# Patient Record
Sex: Male | Born: 1995 | Race: White | Hispanic: No | Marital: Single | Smoking: Never smoker
Health system: Southern US, Community
[De-identification: ages and names within clinical notes are randomized; demographics above are authoritative.]

---
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
## 2016-11-07 ENCOUNTER — Ambulatory Visit
Admission: EM | Admit: 2016-11-07 | Discharge: 2016-11-07 | Disposition: A | Discharge status: Home / Self Care | Payer: BLUE CROSS/BLUE SHIELD | Attending: Family Medicine | Admitting: Family Medicine

## 2016-11-07 DIAGNOSIS — L500 Allergic urticaria: Secondary | ICD-10-CM

## 2016-11-07 MED ORDER — PREDNISONE 20 MG PO TABS: ORAL_TABLET | ORAL | 0 refills | Status: DC

## 2016-11-07 NOTE — Discharge Instructions (Signed)
Zyrtec over the counter and benadryl

## 2016-11-07 NOTE — ED Provider Notes (Signed)
[facility]    CSN: [id number] Arrival date & time: 11/07/16  0934     History   Chief Complaint Chief Complaint  Patient presents with  . Urticaria    HPI Alex George is a 21 y.o. male.   [age] yo male with a c/o hives on his legs, arms and abdomen for 2 days. Patient unsure of what may have triggered this rash. States he's had similar episodes in the past. States he had a recent viral URI (cold) last week. Denies any wheezing, fevers, chills, chest pain, shortness of breath, tongue or throat swelling.       No past medical history on file.  There are no active problems to display for this patient.   No past surgical history on file.     Home Medications    Prior to Admission medications   Medication Sig Start Date End Date Taking? Authorizing Provider  predniSONE (DELTASONE) 20 MG tablet 3 tabs po once day 1, then 2 tabs po qd for 3 days, then 1 tab po qd for 3 days, then half a tab po qd for 2 days 11/07/16   Payton Mccallumonty, Nancie Bocanegra, MD    Family History No family history on file.  Social History Social History  Substance Use Topics  . Smoking status: Not on file  . Smokeless tobacco: Not on file  . Alcohol use Not on file     Allergies   Penicillins   Review of Systems Review of Systems   Physical Exam Triage Vital Signs ED Triage Vitals  Enc Vitals Group     BP 11/07/16 0957 (!) 145/88     Pulse Rate 11/07/16 0957 81     Resp 11/07/16 0957 16     Temp 11/07/16 0957 98.3 F (36.8 C)     Temp Source 11/07/16 0957 Oral     SpO2 11/07/16 0957 99 %     Weight 11/07/16 0955 185 lb (83.9 kg)     Height 11/07/16 0955 6\' 1"  (1.854 m)     Head Circumference --      Peak Flow --      Pain Score --      Pain Loc --      Pain Edu? --      Excl. in GC? --    No data found.   Updated Vital Signs BP (!) 145/88 (BP Location: Left Arm)   Pulse 81   Temp 98.3 F (36.8 C) (Oral)   Resp 16   Ht 6\' 1"  (1.854 m)   Wt 185 lb (83.9 kg)    SpO2 99%   BMI 24.41 kg/m   Visual Acuity Right Eye Distance:   Left Eye Distance:   Bilateral Distance:    Right Eye Near:   Left Eye Near:    Bilateral Near:     Physical Exam  Constitutional: He appears well-developed and well-nourished. No distress.  HENT:  Mouth/Throat: No oropharyngeal exudate.  Neck: Neck supple. No tracheal deviation present.  Cardiovascular: Normal rate, regular rhythm, normal heart sounds and intact distal pulses.   Pulmonary/Chest: Effort normal and breath sounds normal. No stridor. No respiratory distress. He has no wheezes. He has no rales. He exhibits no tenderness.  Skin: Rash noted. Rash is urticarial. He is not diaphoretic.     UC Treatments / Results  Labs (all labs ordered are listed, but only abnormal results are displayed) Labs Reviewed - No data to display  EKG  EKG Interpretation  None       Radiology No results found.  Procedures Procedures (including critical care time)  Medications Ordered in UC Medications - No data to display   Initial Impression / Assessment and Plan / UC Course  I have reviewed the triage vital signs and the nursing notes.  Pertinent labs & imaging results that were available during my care of the patient were reviewed by me and considered in my medical decision making (see chart for details).       Final Clinical Impressions(s) / UC Diagnoses   Final diagnoses:  Allergic urticaria    New Prescriptions Discharge Medication List as of 11/07/2016 10:11 AM    START taking these medications   Details  predniSONE (DELTASONE) 20 MG tablet 3 tabs po once day 1, then 2 tabs po qd for 3 days, then 1 tab po qd for 3 days, then half a tab po qd for 2 days, Normal       1. diagnosis reviewed with patient 2. rx as per orders above; reviewed possible side effects, interactions, risks and benefits  3. Recommend supportive treatment with otc benadryl, zyrtec prn 4. Follow-up prn if symptoms worsen  or don't improve Controlled Substance Prescriptions [place] Controlled Substance Registry consulted? Not Applicable   Payton Mccallum, MD 11/07/16 1119

## 2016-11-07 NOTE — ED Triage Notes (Signed)
Pt reports hives x past 2 days. 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 Unsure what is causing them but did work with insulation earlier in the week. Pt had childhood hives multiple times but never determined the cause. Denies trouble breathing or swallowing. Endorses itching.

## 2018-01-23 ENCOUNTER — Ambulatory Visit
Admission: EM | Admit: 2018-01-23 | Discharge: 2018-01-23 | Disposition: A | Discharge status: Home / Self Care | Payer: BLUE CROSS/BLUE SHIELD | Attending: Family Medicine | Admitting: Family Medicine

## 2018-01-23 DIAGNOSIS — J069 Acute upper respiratory infection, unspecified: Secondary | ICD-10-CM

## 2018-01-23 MED ORDER — CETIRIZINE-PSEUDOEPHEDRINE ER 5-120 MG PO TB12: 1.0000 | ORAL_TABLET | Freq: Two times a day (BID) | ORAL | 0 refills | Status: AC

## 2018-01-23 MED ORDER — IPRATROPIUM BROMIDE 0.06 % NA SOLN: 2.0000 | Freq: Four times a day (QID) | NASAL | 0 refills | Status: DC | PRN

## 2018-01-23 NOTE — ED Triage Notes (Signed)
Pt states for the past 2-3 days. Facial pain, nasal congestion, bilateral ear pain, drainage into his throat, aches and just feels bad. No fever reported. Did take some tylenol and mucinex without relief.

## 2018-01-23 NOTE — ED Provider Notes (Signed)
[facility]    CSN: [id number] Arrival date & time: 01/23/18  [id number]  History   Chief Complaint Chief Complaint  Patient presents with  . Facial Pain   HPI  23 year old male presents with upper respiratory symptoms.  Patient reports a 3-day history of congestion, sinus pressure, ear pain, sore throat, postnasal drip.  His significant other has also been sick.  No fever.  He has taken Tylenol Cold and Mucinex without improvement.  His symptoms are mild to moderate in severity.  Patient is concerned about the development of otitis media.  Patient also does not feel well enough to return to work.  Patient would like a work note today.  No known exacerbating factors.  No other associated symptoms.  No other complaints.  History reviewed and updated as below.  PMH: Patient reports a history of otitis media.  Home Medications    Prior to Admission medications   Medication Sig Start Date End Date Taking? Authorizing Provider  cetirizine-pseudoephedrine (ZYRTEC-D) 5-120 MG tablet Take 1 tablet by mouth 2 (two) times daily. 01/23/18   Everlene Otherook, Darreld Hoffer G, DO  ipratropium (ATROVENT) 0.06 % nasal spray Place 2 sprays into both nostrils 4 (four) times daily as needed for rhinitis. 01/23/18   Tommie Samsook, Thurl Boen G, DO   Family History Family History  Adopted: Yes  Problem Relation Age of Onset  . Thyroid disease Mother   . Irritable bowel syndrome Father     Social History Social History   Tobacco Use  . Smoking status: Never Smoker  . Smokeless tobacco: Never Used  Substance Use Topics  . Alcohol use: Never    Frequency: Never  . Drug use: Yes    Types: Marijuana    Allergies   Penicillins  Review of Systems Review of Systems  Constitutional: Negative for fever.  HENT: Positive for congestion, ear pain, sinus pressure, sinus pain and sore throat.    Physical Exam Triage Vital Signs ED Triage Vitals  Enc Vitals Group     BP 01/23/18 0849 (!) 141/84     Pulse Rate 01/23/18  0849 64     Resp 01/23/18 0849 18     Temp 01/23/18 0849 97.7 F (36.5 C)     Temp Source 01/23/18 0849 Oral     SpO2 01/23/18 0849 100 %     Weight 01/23/18 0851 185 lb (83.9 kg)     Height 01/23/18 0851 6' (1.829 m)     Head Circumference --      Peak Flow --      Pain Score 01/23/18 0851 4     Pain Loc --      Pain Edu? --      Excl. in GC? --    Updated Vital Signs BP (!) 141/84 (BP Location: Left Arm)   Pulse 64   Temp 97.7 F (36.5 C) (Oral)   Resp 18   Ht 6' (1.829 m)   Wt 83.9 kg   SpO2 100%   BMI 25.09 kg/m   Visual Acuity Right Eye Distance:   Left Eye Distance:   Bilateral Distance:    Right Eye Near:   Left Eye Near:    Bilateral Near:     Physical Exam Vitals signs and nursing note reviewed.  Constitutional:      General: He is not in acute distress. HENT:     Head: Normocephalic and atraumatic.     Right Ear: Tympanic membrane normal.     Left Ear: Tympanic  membrane normal.     Nose: Congestion present.     Mouth/Throat:     Pharynx: Oropharynx is clear.  Eyes:     General:        Right eye: No discharge.        Left eye: No discharge.     Conjunctiva/sclera: Conjunctivae normal.  Neck:     Musculoskeletal: Neck supple.  Cardiovascular:     Rate and Rhythm: Normal rate and regular rhythm.  Pulmonary:     Effort: Pulmonary effort is normal.     Breath sounds: No wheezing, rhonchi or rales.  Lymphadenopathy:     Cervical: No cervical adenopathy.  Neurological:     Mental Status: He is alert.  Psychiatric:        Mood and Affect: Mood normal.        Behavior: Behavior normal.    UC Treatments / Results  Labs (all labs ordered are listed, but only abnormal results are displayed) Labs Reviewed - No data to display  EKG None  Radiology No results found.  Procedures Procedures (including critical care time)  Medications Ordered in UC Medications - No data to display  Initial Impression / Assessment and Plan / UC Course  I  have reviewed the triage vital signs and the nursing notes.  Pertinent labs & imaging results that were available during my care of the patient were reviewed by me and considered in my medical decision making (see chart for details).    23 year old male presents with viral URI.  Treating with Atrovent and Zyrtec-D.  Work note given.  Final Clinical Impressions(s) / UC Diagnoses   Final diagnoses:  Viral URI     Discharge Instructions     Medications as prescribed.  Take care  Dr. Adriana Simas    ED Prescriptions    Medication Sig Dispense Auth. Provider   ipratropium (ATROVENT) 0.06 % nasal spray Place 2 sprays into both nostrils 4 (four) times daily as needed for rhinitis. 15 mL Ivyrose Hashman G, DO   cetirizine-pseudoephedrine (ZYRTEC-D) 5-120 MG tablet Take 1 tablet by mouth 2 (two) times daily. 30 tablet Tommie Sams, DO     Controlled Substance Prescriptions [place] Controlled Substance Registry consulted? Not Applicable   Tommie Sams, [place] 01/23/18 6384

## 2018-01-23 NOTE — Discharge Instructions (Signed)
Medications as prescribed. ° °Take care ° °Dr. Gautam Langhorst  °

## 2021-01-11 ENCOUNTER — Telehealth: Payer: Self-pay

## 2021-01-11 ENCOUNTER — Telehealth: Payer: BLUE CROSS/BLUE SHIELD | Admitting: Family

## 2021-01-11 ENCOUNTER — Ambulatory Visit: Payer: Self-pay | Admitting: *Deleted

## 2021-01-11 DIAGNOSIS — R6889 Other general symptoms and signs: Secondary | ICD-10-CM

## 2021-01-11 MED ORDER — FLUTICASONE PROPIONATE 50 MCG/ACT NA SUSP: 2.0000 | Freq: Every day | NASAL | 6 refills | Status: AC

## 2021-01-11 MED ORDER — OSELTAMIVIR PHOSPHATE 75 MG PO CAPS: 75.0000 mg | ORAL_CAPSULE | Freq: Two times a day (BID) | ORAL | 0 refills | Status: DC

## 2021-01-11 NOTE — Progress Notes (Signed)
Virtual Visit Consent   Alex George, you are scheduled for a virtual visit with a [facility] provider today.     Just as with appointments in the office, your consent must be obtained to participate.  Your consent will be active for this visit and any virtual visit you may have with one of our providers in the next 365 days.     If you have a MyChart account, a copy of this consent can be sent to you electronically.  All virtual visits are billed to your insurance company just like a traditional visit in the office.    As this is a virtual visit, video technology does not allow for your provider to perform a traditional examination.  This may limit your provider's ability to fully assess your condition.  If your provider identifies any concerns that need to be evaluated in person or the need to arrange testing (such as labs, EKG, etc.), we will make arrangements to do so.     Although advances in technology are sophisticated, we cannot ensure that it will always work on either your end or our end.  If the connection with a video visit is poor, the visit may have to be switched to a telephone visit.  With either a video or telephone visit, we are not always able to ensure that we have a secure connection.     I need to obtain your verbal consent now.   Are you willing to proceed with your visit today?    Alex George has provided verbal consent on 01/11/2021 for a virtual visit (video or telephone).   Jannifer Rodney, FNP   Date: 01/11/2021 3:00 PM   Virtual Visit via Video Note   I, Jannifer Rodney, connected with  Alex George  ([id number], 09-23-1995) on 01/11/21 at  2:45 PM EST by a video-enabled telemedicine application and verified that I am speaking with the correct person using two identifiers.  Location: Patient: Virtual Visit Location Patient: Home Provider: Virtual Visit Location Provider: Home Office   I discussed the limitations of evaluation and management by  telemedicine and the availability of in person appointments. The patient expressed understanding and agreed to proceed.    History of Present Illness: Alex George is a 25 y.o. who identifies as a male who was assigned male at birth, and is being seen today for fever and congestion. Had family member last week with positive flu.   HPI: Influenza This is a new problem. The current episode started today. The problem occurs intermittently. The problem has been waxing and waning. Associated symptoms include congestion, coughing, fatigue, a fever, headaches and a sore throat. Pertinent negatives include no chills, myalgias, nausea or vomiting. He has tried acetaminophen and NSAIDs for the symptoms. The treatment provided mild relief.   Problems: There are no problems to display for this patient.   Allergies:  Allergies  Allergen Reactions   Penicillins Swelling   Medications:  Current Outpatient Medications:    fluticasone (FLONASE) 50 MCG/ACT nasal spray, Place 2 sprays into both nostrils daily., Disp: 16 g, Rfl: 6   oseltamivir (TAMIFLU) 75 MG capsule, Take 1 capsule (75 mg total) by mouth 2 (two) times daily., Disp: 10 capsule, Rfl: 0   cetirizine-pseudoephedrine (ZYRTEC-D) 5-120 MG tablet, Take 1 tablet by mouth 2 (two) times daily., Disp: 30 tablet, Rfl: 0   ipratropium (ATROVENT) 0.06 % nasal spray, Place 2 sprays into both nostrils 4 (four) times daily as needed for  rhinitis., Disp: 15 mL, Rfl: 0  Observations/Objective: Patient is well-developed, well-nourished in no acute distress.  Resting comfortably  at home.  Head is normocephalic, atraumatic.  No labored breathing.  Speech is clear and coherent with logical content.  Patient is alert and oriented at baseline.  Nasal congestion  Assessment and Plan: 1. Flu-like symptoms - oseltamivir (TAMIFLU) 75 MG capsule; Take 1 capsule (75 mg total) by mouth 2 (two) times daily.  Dispense: 10 capsule; Refill: 0 - fluticasone (FLONASE)  50 MCG/ACT nasal spray; Place 2 sprays into both nostrils daily.  Dispense: 16 g; Refill: 6  Rest, force fluids, tylenol as needed, report any worsening symptoms such as increased shortness of breath, swelling, or continued high fevers. Possible adverse effects discussed with antivirals.    Follow Up Instructions: I discussed the assessment and treatment plan with the patient. The patient was provided an opportunity to ask questions and all were answered. The patient agreed with the plan and demonstrated an understanding of the instructions.  A copy of instructions were sent to the patient via MyChart unless otherwise noted below.     The patient was advised to call back or seek an in-person evaluation if the symptoms worsen or if the condition fails to improve as anticipated.  Time:  I spent 7 minutes with the patient via telehealth technology discussing the above problems/concerns.    Jannifer Rodney, FNP

## 2021-01-11 NOTE — Telephone Encounter (Signed)
Copied from CRM [id number]. Topic: Appointment Scheduling - Scheduling Inquiry for Clinic >> Jan 11, 2021  1:26 PM Elliot Gault wrote: Reason for CRM: Patient spouse MRN # [id number] is a patient of [facility] and would like to know if any providers would accept his as a new patient. Caller would like a follow up call

## 2021-01-11 NOTE — Telephone Encounter (Signed)
Per agent: "Patient experiencing headache, nasal drainage and head pressure for 1 day. Patient has no pcp but patient spouse is an established patient at [facility], caller requested I send a message to see if they will accept her spouse (sent a CRM). Caller would like to hear back from a nurse today regarding acute symptoms. '   Chief Complaint: Sinus pressure, fever Symptoms: Sinus headache, facial pain, fever max 102. Presently 99 Frequency: Onset  1 week ago Pertinent Negatives: Patient denies  Disposition: [] ED /[] Urgent Care (no appt availability in office) / [] Appointment(In office/virtual)/ [x]  [facility]/ [] Home Care/ [] Refused Recommended Disposition  Additional Notes: Advised [place] [facility]. States will follow disposition.     Reason for Disposition  [1] Sinus pain (not just congestion) AND [2] fever  Answer Assessment - Initial Assessment Questions 1. LOCATION: "Where does it hurt?"      Eyes, frontal headache 2. ONSET: "When did the sinus pain start?"  (e.g., hours, days)      LAst week, now pressure 3. SEVERITY: "How bad is the pain?"   (Scale 1-10; mild, moderate or severe)   - MILD (1-3): doesn't interfere with normal activities    - MODERATE (4-7): interferes with normal activities (e.g., work or school) or awakens from sleep   - SEVERE (8-10): excruciating pain and patient unable to do any normal activities        5/10 4. RECURRENT SYMPTOM: "Have you ever had sinus problems before?" If Yes, ask: "When was the last time?" and "What happened that time?"      yes 5. NASAL CONGESTION: "Is the nose blocked?" If Yes, ask: "Can you open it or must you breathe through your mouth?"     yes 6. NASAL DISCHARGE: "Do you have discharge from your nose?" If so ask, "What color?"     Yellowish 7. FEVER: "Do you have a fever?" If Yes, ask: "What is it, how was it measured, and when did it start?"      99.o  101 1nd 102 this AM 8. OTHER SYMPTOMS: "Do you have any  other symptoms?" (e.g., sore throat, cough, earache, difficulty breathing)     Sore throat,  Protocols used: Sinus Pain or Congestion-A-AH

## 2021-01-20 NOTE — Telephone Encounter (Signed)
LVM  that we are taking new patients at this time.

## 2022-11-25 ENCOUNTER — Other Ambulatory Visit: Payer: Self-pay

## 2022-11-25 ENCOUNTER — Emergency Department: Payer: 59

## 2022-11-25 ENCOUNTER — Emergency Department
Admission: EM | Admit: 2022-11-25 | Discharge: 2022-11-25 | Disposition: A | Discharge status: Home / Self Care | Payer: 59 | Attending: Emergency Medicine | Admitting: Emergency Medicine

## 2022-11-25 DIAGNOSIS — W293XXA Contact with powered garden and outdoor hand tools and machinery, initial encounter: Secondary | ICD-10-CM | POA: Diagnosis not present

## 2022-11-25 DIAGNOSIS — S61012A Laceration without foreign body of left thumb without damage to nail, initial encounter: Secondary | ICD-10-CM | POA: Diagnosis not present

## 2022-11-25 DIAGNOSIS — S61412A Laceration without foreign body of left hand, initial encounter: Secondary | ICD-10-CM

## 2022-11-25 DIAGNOSIS — Z23 Encounter for immunization: Secondary | ICD-10-CM | POA: Insufficient documentation

## 2022-11-25 DIAGNOSIS — S6992XA Unspecified injury of left wrist, hand and finger(s), initial encounter: Secondary | ICD-10-CM | POA: Diagnosis present

## 2022-11-25 MED ORDER — SULFAMETHOXAZOLE-TRIMETHOPRIM 800-160 MG PO TABS: 1.0000 | ORAL_TABLET | Freq: Two times a day (BID) | ORAL | 0 refills | Status: AC

## 2022-11-25 MED ORDER — TETANUS-DIPHTHERIA TOXOIDS TD 5-2 LFU IM INJ
0.5000 mL | INJECTION | Freq: Once | INTRAMUSCULAR | Status: AC
  Administered 2022-11-25: 0.5 mL via INTRAMUSCULAR
  Filled 2022-11-25: qty 0.5

## 2022-11-25 MED ORDER — HYDROCODONE-ACETAMINOPHEN 5-325 MG PO TABS
1.0000 | ORAL_TABLET | ORAL | Status: AC
  Administered 2022-11-25: 1 via ORAL
  Filled 2022-11-25: qty 1

## 2022-11-25 MED ORDER — SULFAMETHOXAZOLE-TRIMETHOPRIM 800-160 MG PO TABS
1.0000 | ORAL_TABLET | Freq: Once | ORAL | Status: AC
  Administered 2022-11-25: 1 via ORAL

## 2022-11-25 MED ORDER — LIDOCAINE HCL (PF) 1 % IJ SOLN
5.0000 mL | Freq: Once | INTRAMUSCULAR | Status: AC
  Administered 2022-11-25: 5 mL
  Filled 2022-11-25: qty 5

## 2022-11-25 NOTE — Discharge Instructions (Signed)
Please keep laceration site clean and covered.  You may get laceration site wet but do not submerge underwater.  Have sutures removed by walk-in clinic or urgent care or healthcare provider in 10 to 14 days.

## 2022-11-25 NOTE — ED Provider Notes (Signed)
[facility] AT [facility] Provider Note   CSN: [id number] Arrival date & time: 11/25/22  1803      Chief Complaint  Patient presents with   Laceration    Alex George is a 27 y.o. male presents to the emergency department for evaluation of laceration to the left hand.  Patient underwent laceration to the dorsal aspect of the left hand along the ulnar aspect of the thumb proximal phalanx.  No tendon deficits.  Sensation is intact.  Bleeding well-controlled.  Patient accidentally cut his hand with a saws all.  Tetanus not up-to-date but updated here in the ED.  He is left-hand dominant.  HPI     Home Medications Prior to Admission medications   Medication Sig Start Date End Date Taking? Authorizing Provider  sulfamethoxazole-trimethoprim (BACTRIM DS) 800-160 MG tablet Take 1 tablet by mouth 2 (two) times daily for 7 days. 11/25/22 12/02/22 Yes Evon Slack, PA-C  cetirizine-pseudoephedrine (ZYRTEC-D) 5-120 MG tablet Take 1 tablet by mouth 2 (two) times daily. 01/23/18   Tommie Sams, DO  fluticasone (FLONASE) 50 MCG/ACT nasal spray Place 2 sprays into both nostrils daily. 01/11/21   Jannifer Rodney A, FNP  ipratropium (ATROVENT) 0.06 % nasal spray Place 2 sprays into both nostrils 4 (four) times daily as needed for rhinitis. 01/23/18   Tommie Sams, DO  oseltamivir (TAMIFLU) 75 MG capsule Take 1 capsule (75 mg total) by mouth 2 (two) times daily. 01/11/21   Junie Spencer, FNP      Allergies    Penicillins    Review of Systems   Review of Systems  Physical Exam Updated Vital Signs BP 135/87 (BP Location: Right Arm)   Pulse 66   Temp 98 F (36.7 C)   Resp 18   SpO2 98%  Physical Exam Constitutional:      Appearance: He is well-developed.  HENT:     Head: Normocephalic and atraumatic.  Eyes:     Conjunctiva/sclera: Conjunctivae normal.  Cardiovascular:     Rate and Rhythm: Normal rate.  Pulmonary:     Effort: Pulmonary effort is  normal. No respiratory distress.  Musculoskeletal:        General: Normal range of motion.     Cervical back: Normal range of motion.     Comments: 3 cm laceration ulnar aspect of the dorsum of the left thumb at the base of the proximal phalanx with no tendon deficits noted.  He has full active range of motion of the thumb.  No exposed tendon.  No visible or palpable foreign body.  Skin:    General: Skin is warm.     Findings: No rash.  Neurological:     General: No focal deficit present.     Mental Status: He is alert and oriented to person, place, and time. Mental status is at baseline.  Psychiatric:        Behavior: Behavior normal.        Thought Content: Thought content normal.     ED Results / Procedures / Treatments   Labs (all labs ordered are listed, but only abnormal results are displayed) Labs Reviewed - No data to display  EKG None  Radiology DG Hand 2 View Left  Result Date: 11/25/2022 CLINICAL DATA:  Trauma laceration EXAM: LEFT HAND - 2 VIEW COMPARISON:  None Available. FINDINGS: There is no evidence of fracture or dislocation. There is no evidence of arthropathy or other focal bone abnormality. Soft tissues are  unremarkable. IMPRESSION: Negative. Electronically Signed   By: Jasmine Pang M.D.   On: 11/25/2022 18:58    Procedures .Marland KitchenLaceration Repair  Date/Time: 11/25/2022 8:23 PM  Performed by: Evon Slack, PA-C Authorized by: Evon Slack, PA-C   Consent:    Consent obtained:  Verbal   Consent given by:  Patient   Risks discussed:  Infection Universal protocol:    Patient identity confirmed:  Verbally with patient Anesthesia:    Anesthesia method:  Local infiltration   Local anesthetic:  Lidocaine 1% w/o epi Laceration details:    Length (cm):  3 Treatment:    Area cleansed with:  Povidone-iodine   Amount of cleaning:  Standard   Irrigation solution:  Sterile saline   Irrigation method:  Pressure wash   Visualized foreign bodies/material  removed: no     Debridement:  Moderate Skin repair:    Repair method:  Sutures   Suture size:  4-0   Suture material:  Nylon   Suture technique:  Simple interrupted   Number of sutures:  6 Approximation:    Approximation:  Close Repair type:    Repair type:  Simple Post-procedure details:    Dressing:  Adhesive bandage   Procedure completion:  Tolerated well, no immediate complications     Medications Ordered in ED Medications  sulfamethoxazole-trimethoprim (BACTRIM DS) 800-160 MG per tablet 1 tablet (has no administration in time range)  tetanus & diphtheria toxoids (adult) (TENIVAC) injection 0.5 mL (0.5 mLs Intramuscular Given 11/25/22 1945)  HYDROcodone-acetaminophen (NORCO/VICODIN) 5-325 MG per tablet 1 tablet (1 tablet Oral Given 11/25/22 1944)  lidocaine (PF) (XYLOCAINE) 1 % injection 5 mL (5 mLs Infiltration Given 11/25/22 1944)    ED Course/ Medical Decision Making/ A&P                                 Medical Decision Making Amount and/or Complexity of Data Reviewed Radiology: ordered.  Risk Prescription drug management.  27 year old with laceration to the dorsum of the left hand.  Tetanus updated.  No tendon deficits.  Laceration thoroughly cleansed, no visible or palpable foreign body.  X-ray negative for fracture or foreign body.  Laceration thoroughly cleansed and repaired with six 4-0 nylon sutures.  He is educated on wound care.  He is placed on prophylactic antibiotics.  He is given strict return precautions and understands signs and symptoms return to the ER for Final Clinical Impression(s) / ED Diagnoses Final diagnoses:  Laceration of left hand without foreign body, initial encounter    Rx / DC Orders ED Discharge Orders          Ordered    sulfamethoxazole-trimethoprim (BACTRIM DS) 800-160 MG tablet  2 times daily        11/25/22 2020              Evon Slack, PA-C 11/25/22 2024    Chesley Noon, MD 11/26/22 0009

## 2022-11-25 NOTE — ED Notes (Signed)
See triage notes. Patient was cutting a limb with a sawzall when it slipped onto his hand. Patient unsure of last tetanus shot.

## 2022-11-25 NOTE — ED Notes (Signed)
 Patient discharged from ED by provider. Discharge instructions reviewed with patient and all questions answered. Patient ambulatory from ED in NAD.

## 2022-11-25 NOTE — ED Triage Notes (Signed)
Pt lacerated top of left hand at base of thumb today while cutting tree with a sawzall. Bleeding is controlled without pressure, CMS intact. Wound cleaned with normal saline and wrapped with gauze pads and gauze wrap at this time. Pt is AOX4, pt appears uncomfortable. Pt denies S/D/D, unsure when last tetanus was. No thinners.

## 2023-04-29 ENCOUNTER — Emergency Department
Admission: EM | Admit: 2023-04-29 | Discharge: 2023-04-29 | Disposition: A | Discharge status: Home / Self Care | Attending: Emergency Medicine | Admitting: Emergency Medicine

## 2023-04-29 ENCOUNTER — Other Ambulatory Visit: Payer: Self-pay

## 2023-04-29 DIAGNOSIS — S51811A Laceration without foreign body of right forearm, initial encounter: Secondary | ICD-10-CM | POA: Insufficient documentation

## 2023-04-29 DIAGNOSIS — W268XXA Contact with other sharp object(s), not elsewhere classified, initial encounter: Secondary | ICD-10-CM | POA: Insufficient documentation

## 2023-04-29 DIAGNOSIS — S51812A Laceration without foreign body of left forearm, initial encounter: Secondary | ICD-10-CM

## 2023-04-29 DIAGNOSIS — S59912A Unspecified injury of left forearm, initial encounter: Secondary | ICD-10-CM | POA: Diagnosis present

## 2023-04-29 MED ORDER — LIDOCAINE HCL (PF) 1 % IJ SOLN
5.0000 mL | Freq: Once | INTRAMUSCULAR | Status: AC
  Administered 2023-04-29: 5 mL

## 2023-04-29 NOTE — ED Provider Triage Note (Signed)
 Emergency Medicine Provider Triage Evaluation Note  Alex George, a 28 y.o. male  was evaluated in triage.  Pt complains of laceration to the left forearm.  Patient presents to the ED following an accidental laceration to left arm, as he was walking down a client steps, he tripped, causing portion of the steps to impaled and lacerated his forearm.  He denies any distal paresthesias.  Tetanus is up-to-date.  Review of Systems  Positive: LUE Laceration Negative: FCS  Physical Exam  There were no vitals taken for this visit. Gen:   Awake, no distress  NAD Resp:  Normal effort CTA MSK:   Moves extremities without difficulty. LUE with 2 cm laceration to the dorsal forearm Other:    Medical Decision Making  Medically screening exam initiated at 11:54 AM.  Appropriate orders placed.  [name] was informed that the remainder of the evaluation will be completed by another provider, this initial triage assessment does not replace that evaluation, and the importance of remaining in the ED until their evaluation is complete.  Patient to the ED for evaluation of an accidental laceration of the left upper extremity.   [name], PA-C 04/29/23 1156

## 2023-04-29 NOTE — ED Triage Notes (Signed)
 Patient has laceration to left forearm, bleeding controlled.

## 2023-04-29 NOTE — ED Provider Notes (Signed)
 [facility] Emergency Department Provider Note     Event Date/Time   First MD Initiated Contact with Patient 04/29/23 1157     (approximate)   History   Laceration   HPI  Alex George is a 28 y.o. male presents to the ED for evaluation of an accidental laceration to the LUE. He got caught & cut with what he believes was an exposed nail. This work-related injury occurred just prior to arrival. He endorses an UTD tetanus status.    Physical Exam   Triage Vital Signs: ED Triage Vitals  Encounter Vitals Group     BP 04/29/23 1157 (!) 158/97     Systolic BP Percentile --      Diastolic BP Percentile --      Pulse Rate 04/29/23 1155 65     Resp 04/29/23 1155 20     Temp 04/29/23 1155 98.2 F (36.8 C)     Temp Source 04/29/23 1155 Oral     SpO2 04/29/23 1155 98 %     Weight --      Height --      Head Circumference --      Peak Flow --      Pain Score 04/29/23 1156 3     Pain Loc --      Pain Education --      Exclude from Growth Chart --     Most recent vital signs: Vitals:   04/29/23 1155 04/29/23 1157  BP:  (!) 158/97  Pulse: 65   Resp: 20   Temp: 98.2 F (36.8 C)   SpO2: 98%     General Awake, no distress. NAD HEENT NCAT. PERRL. EOMI. No rhinorrhea. Mucous membranes are moist.  CV:  Good peripheral perfusion. RRR RESP:  Normal effort. CTA ABD:  No distention.  MSK:  LUE with a 2 cm laceration to the dorsal forearm   ED Results / Procedures / Treatments   Labs (all labs ordered are listed, but only abnormal results are displayed) Labs Reviewed - No data to display   EKG   RADIOLOGY  No results found.   PROCEDURES:  Critical Care performed: No  .Laceration Repair  Date/Time: 04/29/2023 1:11 PM  Performed by: [name], PA-C Authorized by: [name], PA-C   Consent:    Consent obtained:  Verbal   Consent given by:  Patient   Risks, benefits, and alternatives were discussed: yes      Risks discussed:  Pain and poor cosmetic result Universal protocol:    Site/side marked: yes     Patient identity confirmed:  Verbally with patient Anesthesia:    Anesthesia method:  Local infiltration   Local anesthetic:  Lidocaine 1% w/o epi Laceration details:    Location:  Shoulder/arm   Shoulder/arm location:  L lower arm   Length (cm):  2.5   Depth (mm):  5 Pre-procedure details:    Preparation:  Patient was prepped and draped in usual sterile fashion Exploration:    Limited defect created (wound extended): no     Hemostasis achieved with:  Direct pressure   Contaminated: no   Treatment:    Area cleansed with:  Saline and povidone-iodine   Amount of cleaning:  Standard   Irrigation solution:  Sterile saline   Irrigation volume:  10   Irrigation method:  Syringe   Debridement:  None   Undermining:  None   Scar revision: no   Skin  repair:    Repair method:  Sutures   Suture size:  4-0   Suture material:  Nylon   Suture technique:  Simple interrupted   Number of sutures:  4 Approximation:    Approximation:  Close Repair type:    Repair type:  Simple Post-procedure details:    Dressing:  Non-adherent dressing   Procedure completion:  Tolerated well, no immediate complications    MEDICATIONS ORDERED IN ED: Medications  lidocaine (PF) (XYLOCAINE) 1 % injection 5 mL (5 mLs Infiltration Given by Other 04/29/23 1301)     IMPRESSION / MDM / ASSESSMENT AND PLAN / ED COURSE  I reviewed the triage vital signs and the nursing notes.                              Differential diagnosis includes, but is not limited to, laceration, abrasion, contusion, hematoma  Patient's presentation is most consistent with acute, uncomplicated illness.  Patient's diagnosis is consistent with a forearm laceration. Wound is repaired with nylon sutures. Patient will be discharged home with wound care instructions. Patient is to follow up with a local [facility] for suture removal in 10-12  days, as needed or otherwise directed. Patient is given ED precautions to return to the ED for any worsening or new symptoms.   FINAL CLINICAL IMPRESSION(S) / ED DIAGNOSES   Final diagnoses:  Laceration of left forearm, initial encounter     Rx / DC Orders   ED Discharge Orders     None        Note:  This document was prepared using Dragon voice recognition software and may include unintentional dictation errors.    [name], PA-C 04/29/23 1912    Jene Every, MD 04/30/23 [id number]

## 2023-04-29 NOTE — Discharge Instructions (Addendum)
Keep the wound clean, dry, and covered. See a local urgent care center for suture removal in 10-12 days. 

## 2023-08-12 ENCOUNTER — Ambulatory Visit
Admission: EM | Admit: 2023-08-12 | Discharge: 2023-08-12 | Disposition: A | Discharge status: Home / Self Care | Attending: Family Medicine | Admitting: Family Medicine

## 2023-08-12 ENCOUNTER — Encounter: Payer: Self-pay | Admitting: Emergency Medicine

## 2023-08-12 DIAGNOSIS — J069 Acute upper respiratory infection, unspecified: Secondary | ICD-10-CM | POA: Diagnosis not present

## 2023-08-12 DIAGNOSIS — H66001 Acute suppurative otitis media without spontaneous rupture of ear drum, right ear: Secondary | ICD-10-CM

## 2023-08-12 DIAGNOSIS — R059 Cough, unspecified: Secondary | ICD-10-CM | POA: Diagnosis not present

## 2023-08-12 MED ORDER — AZITHROMYCIN 250 MG PO TABS: ORAL_TABLET | ORAL | 0 refills | Status: AC

## 2023-08-12 NOTE — ED Triage Notes (Signed)
 Pt presents with sinus pressure, right ear pain and sore throat x 8 days.

## 2023-08-12 NOTE — Discharge Instructions (Signed)
 Stop by the pharmacy to pick up your prescriptions.  Follow up with your primary care provider or return to the urgent care, if not improving.

## 2023-08-12 NOTE — ED Provider Notes (Addendum)
 [facility]    CSN: [id number] Arrival date & time: 08/12/23  1401      History   Chief Complaint Chief Complaint  Patient presents with   Facial Pain   Sore Throat   Otalgia    HPI Alex George is a 28 y.o. male.   HPI  History obtained from the patient. Alex George presents for sinus congestion, facial pain, with sore throat for over a week. This morning, his right ear started hurting.  Pain in right ear is constant.   Has been taking tylenol  for pain.  No fever, vomiting, diarrhea. His son was sick while at the beach but he is feeling better.     History reviewed. No pertinent past medical history.  There are no active problems to display for this patient.   History reviewed. No pertinent surgical history.     Home Medications    Prior to Admission medications   Medication Sig Start Date End Date Taking? Authorizing Provider  azithromycin  (ZITHROMAX  Z-PAK) 250 MG tablet Take 2 tablets on day 1 then 1 tablet daily 08/12/23  Yes Lyda Colcord, DO  cetirizine -pseudoephedrine  (ZYRTEC -D) 5-120 MG tablet Take 1 tablet by mouth 2 (two) times daily. 01/23/18   Cook, Jayce G, DO  fluticasone  (FLONASE ) 50 MCG/ACT nasal spray Place 2 sprays into both nostrils daily. 01/11/21   Lavell Bari LABOR, FNP    Family History Family History  Adopted: Yes  Problem Relation Age of Onset   Thyroid disease Mother    Irritable bowel syndrome Father     Social History Social History   Tobacco Use   Smoking status: Never   Smokeless tobacco: Never  Vaping Use   Vaping status: Never Used  Substance Use Topics   Alcohol use: Never   Drug use: Not Currently    Types: Marijuana     Allergies   Penicillins   Review of Systems Review of Systems: negative unless otherwise stated in HPI.      Physical Exam Triage Vital Signs ED Triage Vitals  Encounter Vitals Group     BP 08/12/23 1413 133/86     Girls Systolic BP Percentile --      Girls Diastolic BP  Percentile --      Boys Systolic BP Percentile --      Boys Diastolic BP Percentile --      Pulse Rate 08/12/23 1413 76     Resp 08/12/23 1413 16     Temp 08/12/23 1413 99.8 F (37.7 C)     Temp Source 08/12/23 1413 Oral     SpO2 08/12/23 1413 99 %     Weight --      Height --      Head Circumference --      Peak Flow --      Pain Score 08/12/23 1411 3     Pain Loc --      Pain Education --      Exclude from Growth Chart --    No data found.  Updated Vital Signs BP 133/86 (BP Location: Left Arm)   Pulse 76   Temp 99.8 F (37.7 C) (Oral)   Resp 16   SpO2 99%   Visual Acuity Right Eye Distance:   Left Eye Distance:   Bilateral Distance:    Right Eye Near:   Left Eye Near:    Bilateral Near:     Physical Exam GEN:     alert, non-toxic appearing male in no distress  HENT:  mucus membranes moist, oropharyngeal without lesions or erythema, no tonsillar hypertrophy or exudates,  moderate erythematous edematous turbinates, clear nasal discharge, left TM normal, right TM bulging and erythematous EYES:   pupils equal and reactive, no scleral injection or discharge NECK:  normal ROM, no lymphadenopathy, no meningismus   RESP:  no increased work of breathing, clear to auscultation bilaterally CVS:   regular rate and rhythm Skin:   warm and dry    UC Treatments / Results  Labs (all labs ordered are listed, but only abnormal results are displayed) Labs Reviewed - No data to display  EKG   Radiology No results found.  Procedures Procedures (including critical care time)  Medications Ordered in UC Medications - No data to display  Initial Impression / Assessment and Plan / UC Course  I have reviewed the triage vital signs and the nursing notes.  Pertinent labs & imaging results that were available during my care of the patient were reviewed by me and considered in my medical decision making (see chart for details).       Pt is a 28 y.o. male who presents  for 8 days of respiratory symptoms. Alex George is afebrile here without recent antipyretics. Satting well on room air. Overall pt is non-toxic appearing, well hydrated, without respiratory distress. Pulmonary exam is unremarkable.  COVID and influenza panel due to duration of symptoms. Suspect viral respiratory illness. Discussed symptomatic treatment.  He does have evidence of right otitis media. Treat with azithromycin  as below.  Typical duration of symptoms discussed.   Return and ED precautions given and voiced understanding. Discussed MDM, treatment plan and plan for follow-up with patient who agrees with plan.     Final Clinical Impressions(s) / UC Diagnoses   Final diagnoses:  Non-recurrent acute suppurative otitis media of right ear without spontaneous rupture of tympanic membrane  URI with cough and congestion     Discharge Instructions      Stop by the pharmacy to pick up your prescriptions.  Follow up with your primary care provider or return to the urgent care, if not improving.       ED Prescriptions     Medication Sig Dispense Auth. Provider   azithromycin  (ZITHROMAX  Z-PAK) 250 MG tablet Take 2 tablets on day 1 then 1 tablet daily 6 tablet Zhanae Proffit, DO      PDMP not reviewed this encounter.   Kriste Berth, DO 08/12/23 1435    Kriste Berth, DO 08/12/23 1435

## 2023-10-29 ENCOUNTER — Ambulatory Visit: Admitting: Physician Assistant

## 2023-11-06 ENCOUNTER — Encounter: Payer: Self-pay | Admitting: General Practice
